# Patient Record
Sex: Female | Born: 1955 | Race: White | Hispanic: No | Marital: Married | Smoking: Never smoker
Health system: Southern US, Community
[De-identification: ages and names within clinical notes are randomized; demographics above are authoritative.]

## PROBLEM LIST (undated history)

## (undated) DIAGNOSIS — G5600 Carpal tunnel syndrome, unspecified upper limb: Secondary | ICD-10-CM

## (undated) HISTORY — PX: TONSILLECTOMY: SUR1361

---

## 2002-01-24 ENCOUNTER — Ambulatory Visit (HOSPITAL_COMMUNITY): Admission: RE | Admit: 2002-01-24 | Discharge: 2002-01-24 | Payer: Self-pay | Admitting: *Deleted

## 2002-01-24 ENCOUNTER — Encounter: Payer: Self-pay | Admitting: *Deleted

## 2003-02-04 ENCOUNTER — Ambulatory Visit (HOSPITAL_COMMUNITY): Admission: RE | Admit: 2003-02-04 | Discharge: 2003-02-04 | Payer: Self-pay | Admitting: *Deleted

## 2003-02-04 ENCOUNTER — Encounter: Payer: Self-pay | Admitting: *Deleted

## 2003-03-11 ENCOUNTER — Other Ambulatory Visit: Admission: RE | Admit: 2003-03-11 | Discharge: 2003-03-11 | Payer: Self-pay | Admitting: *Deleted

## 2004-02-24 ENCOUNTER — Ambulatory Visit (HOSPITAL_COMMUNITY): Admission: RE | Admit: 2004-02-24 | Discharge: 2004-02-24 | Payer: Self-pay | Admitting: *Deleted

## 2004-05-12 ENCOUNTER — Other Ambulatory Visit: Admission: RE | Admit: 2004-05-12 | Discharge: 2004-05-12 | Payer: Self-pay | Admitting: *Deleted

## 2005-05-10 ENCOUNTER — Ambulatory Visit (HOSPITAL_COMMUNITY): Admission: RE | Admit: 2005-05-10 | Discharge: 2005-05-10 | Payer: Self-pay | Admitting: *Deleted

## 2005-12-22 ENCOUNTER — Other Ambulatory Visit: Admission: RE | Admit: 2005-12-22 | Discharge: 2005-12-22 | Payer: Self-pay | Admitting: *Deleted

## 2006-05-31 ENCOUNTER — Ambulatory Visit (HOSPITAL_COMMUNITY): Admission: RE | Admit: 2006-05-31 | Discharge: 2006-05-31 | Payer: Self-pay | Admitting: *Deleted

## 2007-02-20 ENCOUNTER — Other Ambulatory Visit: Admission: RE | Admit: 2007-02-20 | Discharge: 2007-02-20 | Payer: Self-pay | Admitting: *Deleted

## 2007-08-29 ENCOUNTER — Ambulatory Visit (HOSPITAL_COMMUNITY): Admission: RE | Admit: 2007-08-29 | Discharge: 2007-08-29 | Payer: Self-pay | Admitting: *Deleted

## 2008-04-09 ENCOUNTER — Other Ambulatory Visit: Admission: RE | Admit: 2008-04-09 | Discharge: 2008-04-09 | Payer: Self-pay | Admitting: Gynecology

## 2008-09-02 ENCOUNTER — Ambulatory Visit (HOSPITAL_COMMUNITY): Admission: RE | Admit: 2008-09-02 | Discharge: 2008-09-02 | Payer: Self-pay | Admitting: Gynecology

## 2009-09-09 ENCOUNTER — Ambulatory Visit (HOSPITAL_COMMUNITY): Admission: RE | Admit: 2009-09-09 | Discharge: 2009-09-09 | Payer: Self-pay | Admitting: Gynecology

## 2010-09-13 ENCOUNTER — Ambulatory Visit (HOSPITAL_COMMUNITY)
Admission: RE | Admit: 2010-09-13 | Discharge: 2010-09-13 | Payer: Self-pay | Source: Home / Self Care | Attending: Gynecology | Admitting: Gynecology

## 2010-10-08 ENCOUNTER — Ambulatory Visit (HOSPITAL_COMMUNITY)
Admission: RE | Admit: 2010-10-08 | Discharge: 2010-10-08 | Payer: Self-pay | Source: Home / Self Care | Attending: Obstetrics and Gynecology | Admitting: Obstetrics and Gynecology

## 2010-10-08 LAB — ABO/RH: ABO/RH(D): A POS

## 2010-10-08 LAB — TYPE AND SCREEN
ABO/RH(D): A POS
Antibody Screen: NEGATIVE

## 2010-10-08 LAB — CBC
HCT: 36.8 % (ref 36.0–46.0)
Hemoglobin: 12.3 g/dL (ref 12.0–15.0)
MCH: 31.4 pg (ref 26.0–34.0)
MCHC: 33.4 g/dL (ref 30.0–36.0)
MCV: 93.9 fL (ref 78.0–100.0)
Platelets: 241 10*3/uL (ref 150–400)
RBC: 3.92 MIL/uL (ref 3.87–5.11)
RDW: 14.6 % (ref 11.5–15.5)
WBC: 5.2 10*3/uL (ref 4.0–10.5)

## 2011-08-19 ENCOUNTER — Other Ambulatory Visit (HOSPITAL_COMMUNITY): Payer: Self-pay | Admitting: Obstetrics and Gynecology

## 2011-08-19 DIAGNOSIS — Z1231 Encounter for screening mammogram for malignant neoplasm of breast: Secondary | ICD-10-CM

## 2011-09-21 ENCOUNTER — Ambulatory Visit (HOSPITAL_COMMUNITY)
Admission: RE | Admit: 2011-09-21 | Discharge: 2011-09-21 | Disposition: A | Discharge status: Home / Self Care | Payer: BC Managed Care – PPO | Source: Ambulatory Visit | Attending: Obstetrics and Gynecology | Admitting: Obstetrics and Gynecology

## 2011-09-21 DIAGNOSIS — Z1231 Encounter for screening mammogram for malignant neoplasm of breast: Secondary | ICD-10-CM | POA: Insufficient documentation

## 2012-08-21 ENCOUNTER — Other Ambulatory Visit (HOSPITAL_COMMUNITY): Payer: Self-pay | Admitting: Obstetrics and Gynecology

## 2012-08-21 DIAGNOSIS — Z1231 Encounter for screening mammogram for malignant neoplasm of breast: Secondary | ICD-10-CM

## 2012-09-21 ENCOUNTER — Ambulatory Visit (HOSPITAL_COMMUNITY)
Admission: RE | Admit: 2012-09-21 | Discharge: 2012-09-21 | Disposition: A | Discharge status: Home / Self Care | Payer: BC Managed Care – PPO | Source: Ambulatory Visit | Attending: Obstetrics and Gynecology | Admitting: Obstetrics and Gynecology

## 2012-09-21 DIAGNOSIS — Z1231 Encounter for screening mammogram for malignant neoplasm of breast: Secondary | ICD-10-CM | POA: Insufficient documentation

## 2013-10-30 ENCOUNTER — Other Ambulatory Visit (HOSPITAL_COMMUNITY): Payer: Self-pay | Admitting: Obstetrics and Gynecology

## 2013-10-30 DIAGNOSIS — Z1231 Encounter for screening mammogram for malignant neoplasm of breast: Secondary | ICD-10-CM

## 2013-11-06 ENCOUNTER — Ambulatory Visit (HOSPITAL_COMMUNITY)
Admission: RE | Admit: 2013-11-06 | Discharge: 2013-11-06 | Disposition: A | Discharge status: Home / Self Care | Payer: BC Managed Care – PPO | Source: Ambulatory Visit | Attending: Obstetrics and Gynecology | Admitting: Obstetrics and Gynecology

## 2013-11-06 DIAGNOSIS — Z1231 Encounter for screening mammogram for malignant neoplasm of breast: Secondary | ICD-10-CM

## 2015-01-01 ENCOUNTER — Other Ambulatory Visit: Payer: Self-pay | Admitting: Obstetrics and Gynecology

## 2015-01-02 LAB — CYTOLOGY - PAP

## 2015-01-06 ENCOUNTER — Other Ambulatory Visit: Payer: Self-pay | Admitting: Obstetrics and Gynecology

## 2015-01-06 DIAGNOSIS — R928 Other abnormal and inconclusive findings on diagnostic imaging of breast: Secondary | ICD-10-CM

## 2015-01-09 ENCOUNTER — Ambulatory Visit
Admission: RE | Admit: 2015-01-09 | Discharge: 2015-01-09 | Disposition: A | Discharge status: Home / Self Care | Payer: BLUE CROSS/BLUE SHIELD | Source: Ambulatory Visit | Attending: Obstetrics and Gynecology | Admitting: Obstetrics and Gynecology

## 2015-01-09 DIAGNOSIS — R928 Other abnormal and inconclusive findings on diagnostic imaging of breast: Secondary | ICD-10-CM

## 2015-01-13 ENCOUNTER — Other Ambulatory Visit: Payer: PRIVATE HEALTH INSURANCE

## 2015-07-16 IMAGING — US US BREAST*R* LIMITED INC AXILLA
1 series · 1 of 1 positions shown · non-contrast
Comparison: Prior exams

ACR Breast Density Category a: The breast tissue is almost entirely
fatty.

CLINICAL DATA: Call back from screening for a possible right breast
mass.

EXAM:
DIGITAL DIAGNOSTIC RIGHT MAMMOGRAM WITH 3D TOMOSYNTHESIS WITH CAD
ULTRASOUND RIGHT BREAST

[Series 1: us breast*right* limited inc axilla · 1 of 1 slices shown]
[im 1/1]
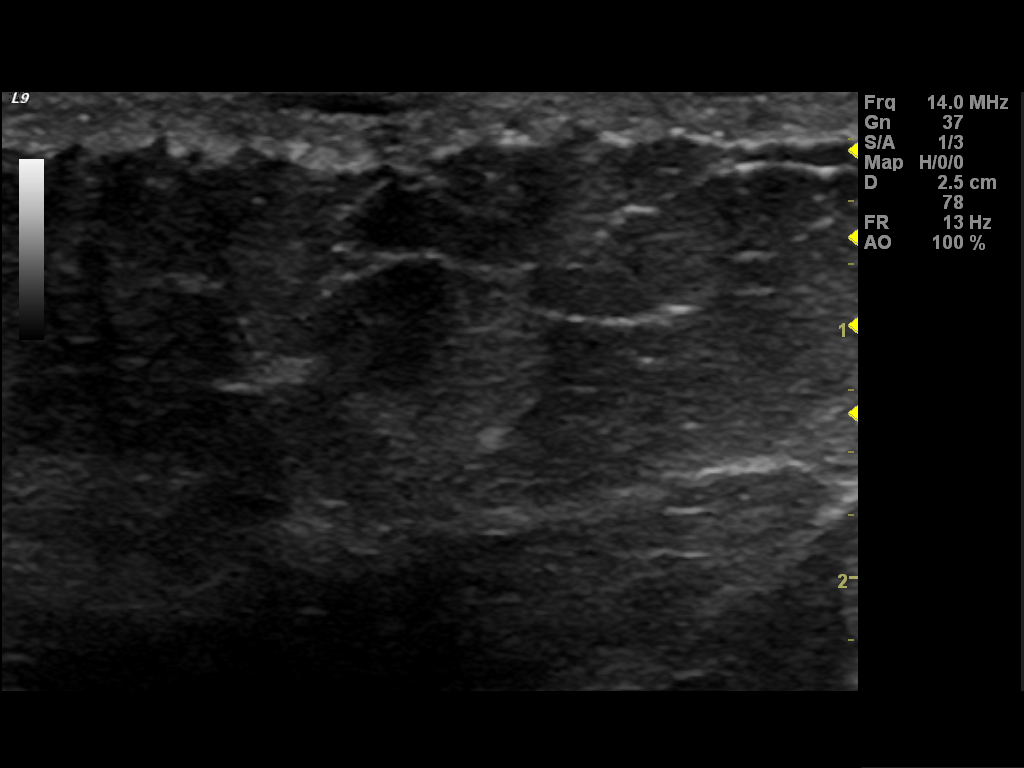

[1 of 1 positions shown; findings below may reference images not displayed]

FINDINGS: Possible mass persists on the 3D imaging projecting along the
inferior margin of the right breast, possibly within the skin. No
other abnormalities.

Mammographic images were processed with CAD.

On physical exam, there is a well-defined raised mole along the 6
o'clock position of the right breast. No other abnormality on exam.

Targeted ultrasound is performed, showing the superficial mole in
the 6 o'clock position, 4 cm the nipple. There are no breast masses
or cysts.
IMPRESSION: Benign finding. The mammographic abnormality is a skin mole. No
evidence of malignancy.

RECOMMENDATION:
Screening mammogram in one year.(Code:TC-3-4Y8)

I have discussed the findings and recommendations with the patient.
Results were also provided in writing at the conclusion of the
visit. If applicable, a reminder letter will be sent to the patient
regarding the next appointment.

BI-RADS CATEGORY  2: Benign.

## 2015-07-16 IMAGING — MG MM DIAG BREAST TOMO UNI RIGHT
5 series · 6 of 13 positions shown · non-contrast
Comparison: Prior exams

ACR Breast Density Category a: The breast tissue is almost entirely
fatty.

CLINICAL DATA: Call back from screening for a possible right breast
mass.

EXAM:
DIGITAL DIAGNOSTIC RIGHT MAMMOGRAM WITH 3D TOMOSYNTHESIS WITH CAD
ULTRASOUND RIGHT BREAST

[R TAN]
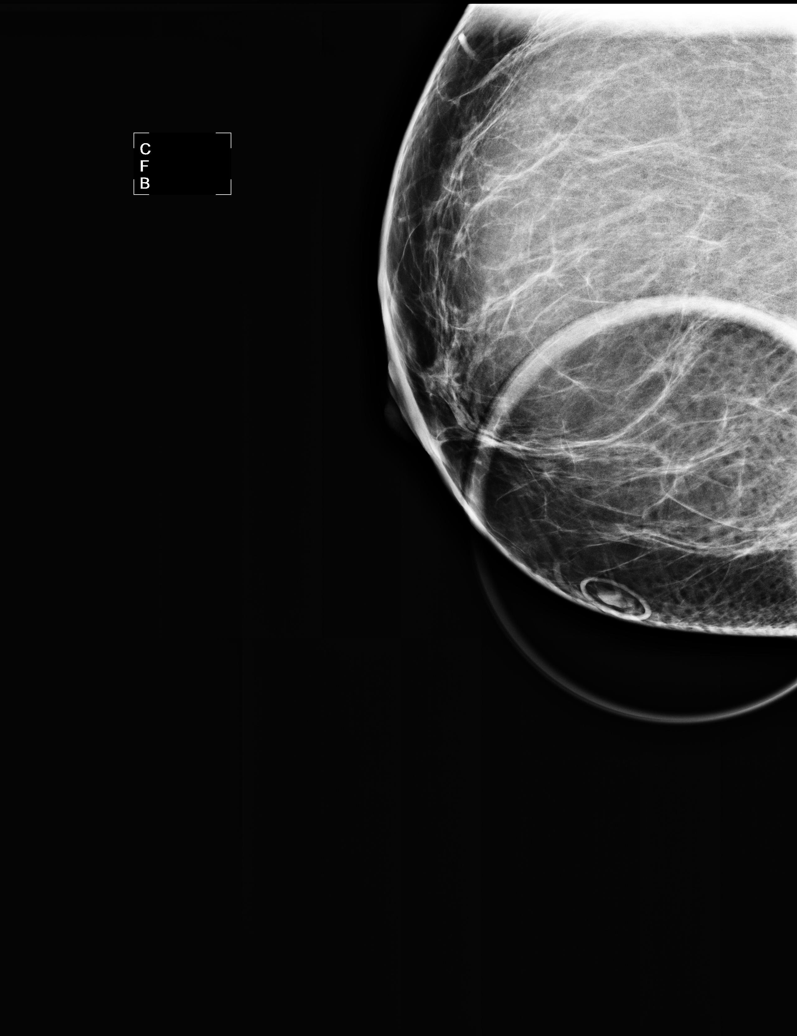

[R CC]
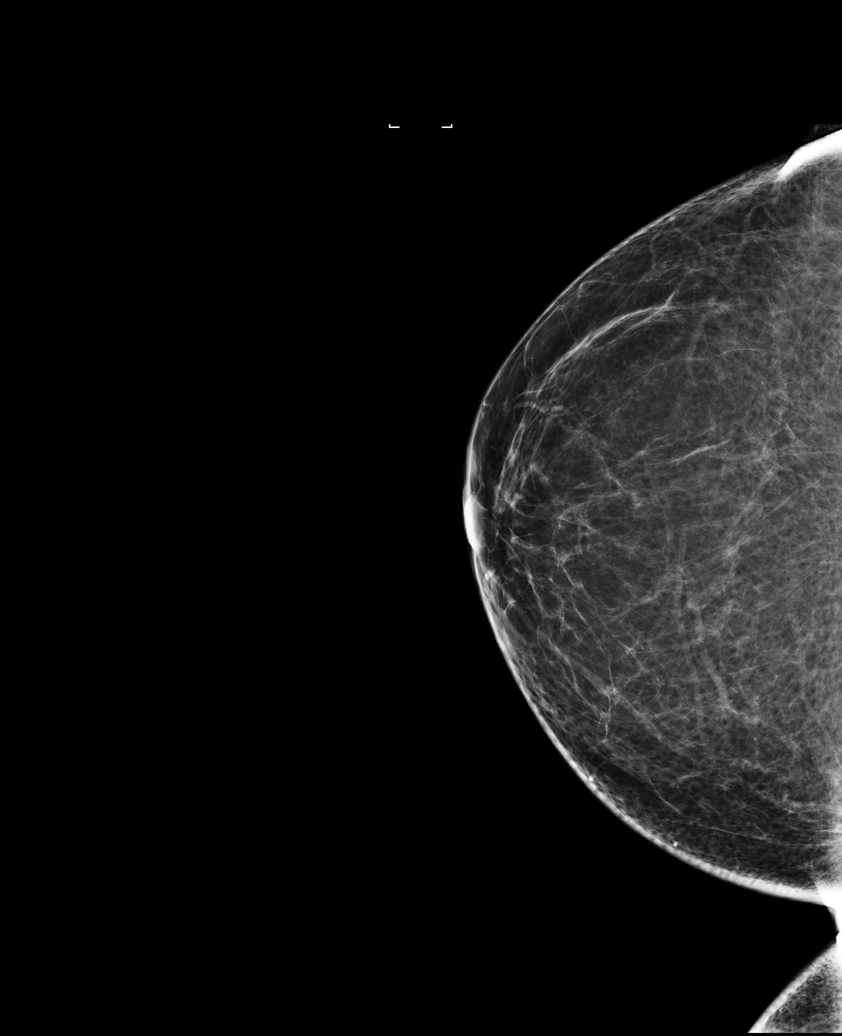

[R MLO]
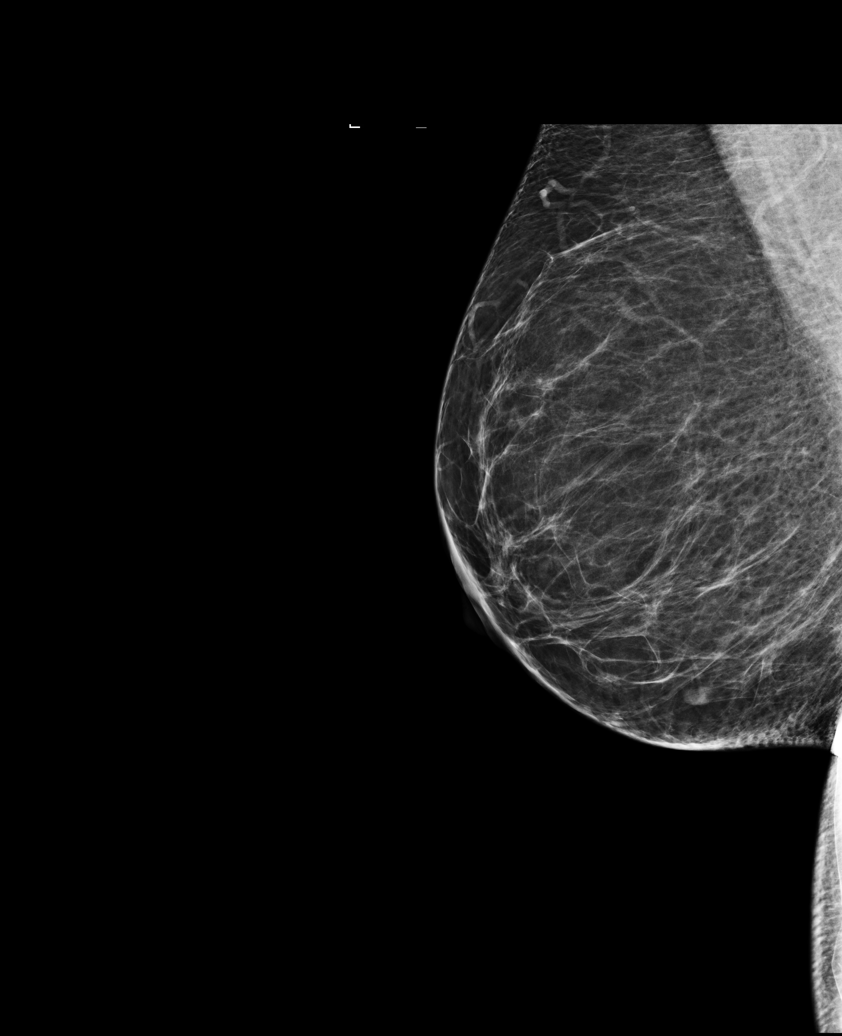

[R MLO tomo · 2 of 98 frames shown]
[frame 32/98]
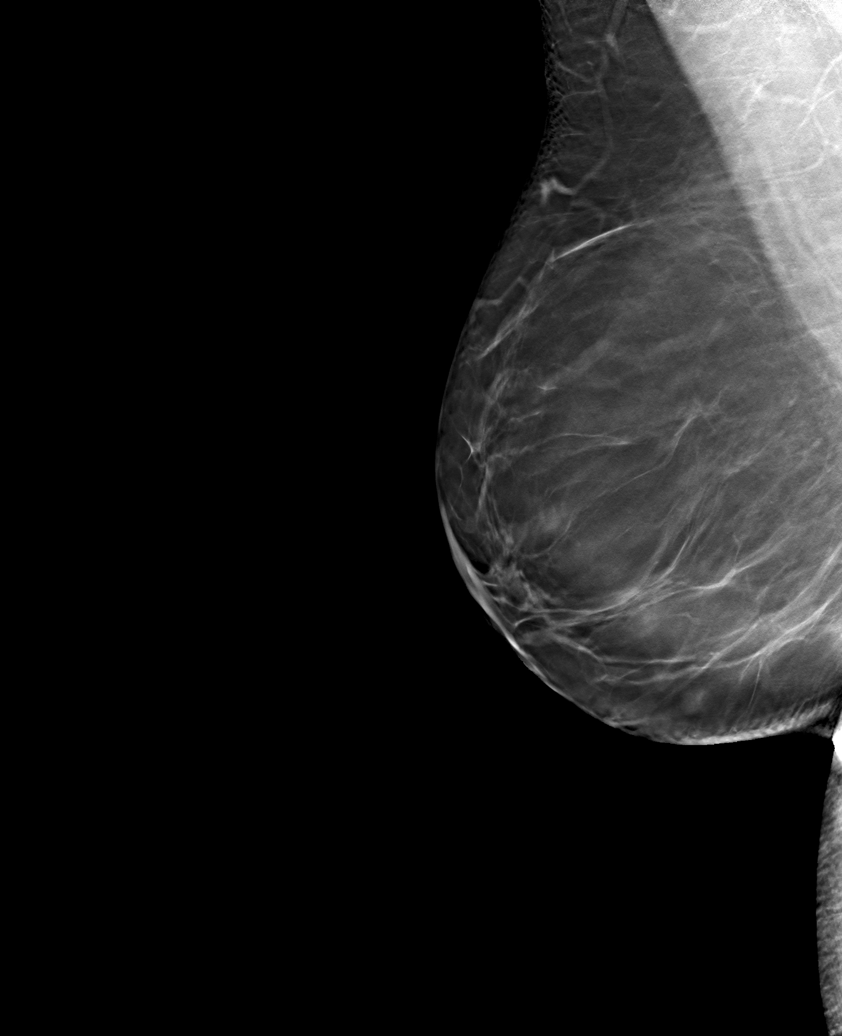
[frame 49/98]
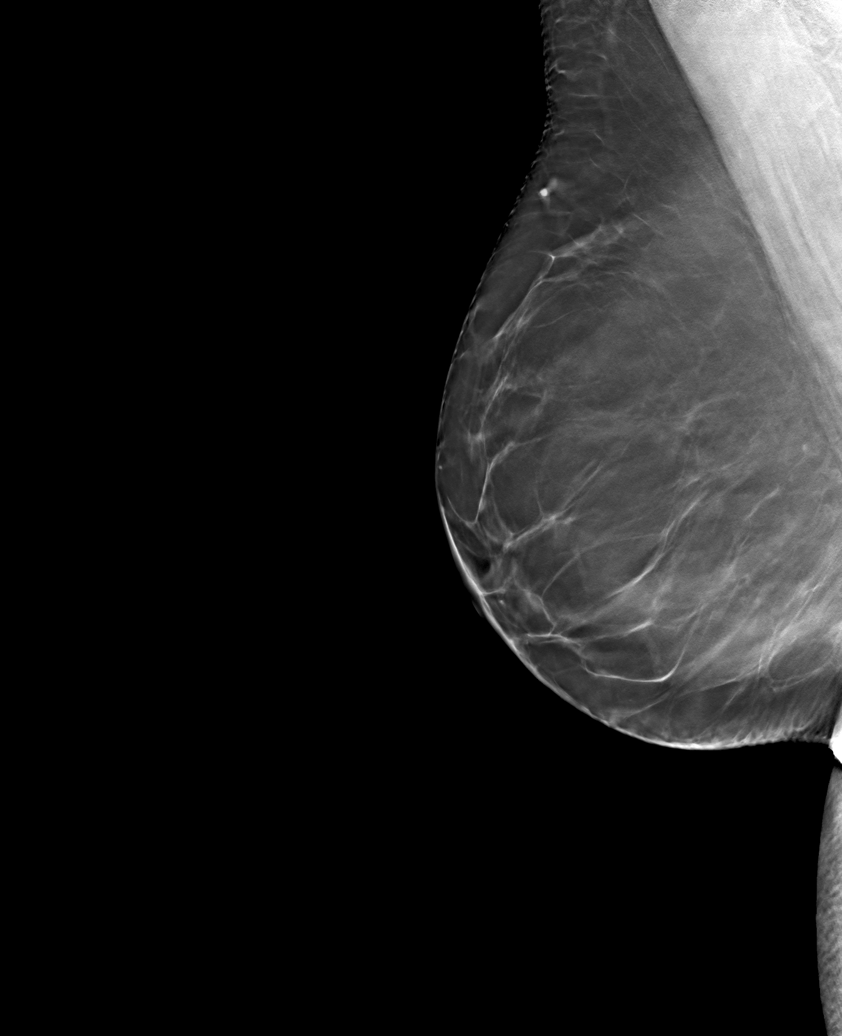

[R CC tomo · tomo slice 47/94.0]
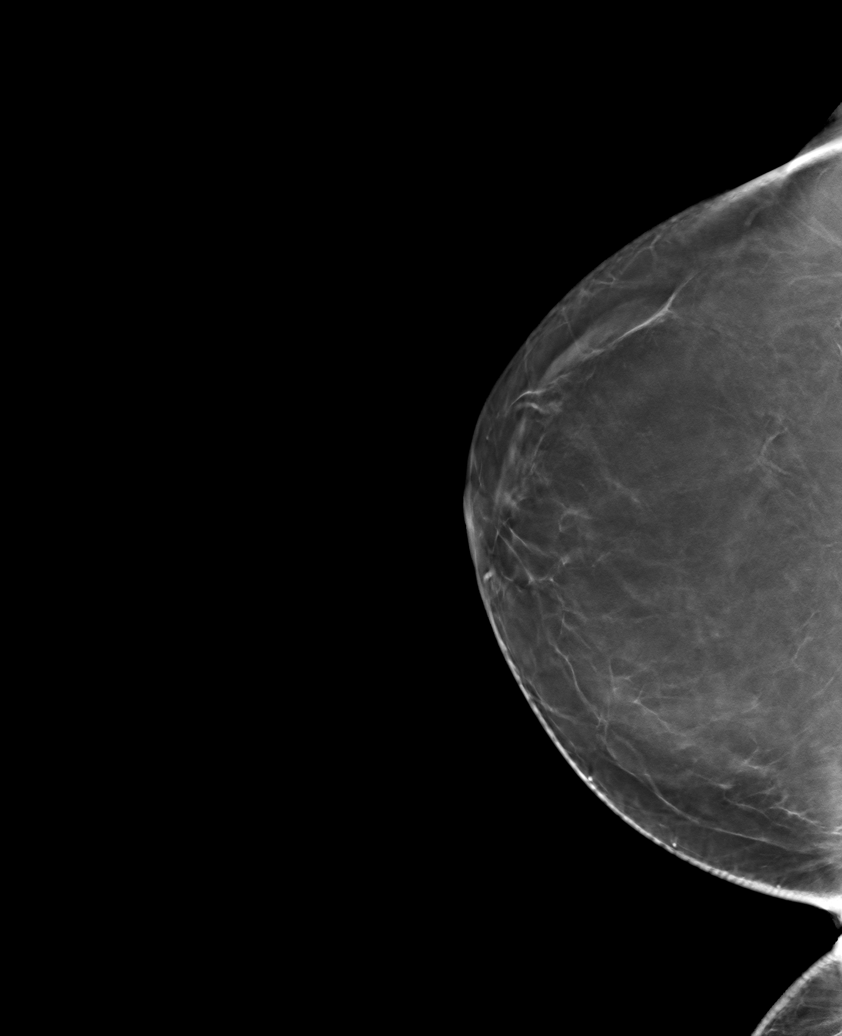

[6 of 13 positions shown; findings below may reference images not displayed]

FINDINGS: Possible mass persists on the 3D imaging projecting along the
inferior margin of the right breast, possibly within the skin. No
other abnormalities.

Mammographic images were processed with CAD.

On physical exam, there is a well-defined raised mole along the 6
o'clock position of the right breast. No other abnormality on exam.

Targeted ultrasound is performed, showing the superficial mole in
the 6 o'clock position, 4 cm the nipple. There are no breast masses
or cysts.
IMPRESSION: Benign finding. The mammographic abnormality is a skin mole. No
evidence of malignancy.

RECOMMENDATION:
Screening mammogram in one year.(Code:TC-3-4Y8)

I have discussed the findings and recommendations with the patient.
Results were also provided in writing at the conclusion of the
visit. If applicable, a reminder letter will be sent to the patient
regarding the next appointment.

BI-RADS CATEGORY  2: Benign.

## 2015-11-02 ENCOUNTER — Ambulatory Visit (AMBULATORY_SURGERY_CENTER): Payer: Self-pay | Admitting: *Deleted

## 2015-11-02 VITALS — Ht 64.0 in | Wt 182.0 lb

## 2015-11-02 DIAGNOSIS — Z1211 Encounter for screening for malignant neoplasm of colon: Secondary | ICD-10-CM

## 2015-11-02 NOTE — Progress Notes (Signed)
Patient denies any allergies to eggs or soy. Patient denies any problems with anesthesia/sedation. Patient denies any oxygen use at home and does not take any diet/weight loss medications. Patient declined EMMI education today.  

## 2015-11-13 ENCOUNTER — Ambulatory Visit (AMBULATORY_SURGERY_CENTER): Payer: BLUE CROSS/BLUE SHIELD | Admitting: Internal Medicine

## 2015-11-13 ENCOUNTER — Encounter: Payer: Self-pay | Admitting: Internal Medicine

## 2015-11-13 VITALS — BP 130/68 | HR 53 | Temp 97.3°F | Resp 16 | Ht 64.0 in | Wt 182.0 lb

## 2015-11-13 DIAGNOSIS — Z1211 Encounter for screening for malignant neoplasm of colon: Secondary | ICD-10-CM

## 2015-11-13 MED ORDER — SODIUM CHLORIDE 0.9 % IV SOLN: 500.0000 mL | INTRAVENOUS | Status: DC

## 2015-11-13 NOTE — Progress Notes (Signed)
Procedure tolerated well, to pacu, vss, spont resp, patent a/w, report to [employer]

## 2015-11-13 NOTE — Patient Instructions (Addendum)
The colonoscopy was normal.  Next routine colonoscopy in 10 years - 2027  I appreciate the opportunity to care for you. Iva Boop, MD, [facility]   Discharge instructions given. Normal exam. Resume previous medications. YOU HAD AN ENDOSCOPIC PROCEDURE TODAY AT THE [facility]:   Refer to the procedure report that was given to you for any specific questions about what was found during the examination.  If the procedure report does not answer your questions, please call your gastroenterologist to clarify.  If you requested that your care partner not be given the details of your procedure findings, then the procedure report has been included in a sealed envelope for you to review at your convenience later.  YOU SHOULD EXPECT: Some feelings of bloating in the abdomen. Passage of more gas than usual.  Walking can help get rid of the air that was put into your GI tract during the procedure and reduce the bloating. If you had a lower endoscopy (such as a colonoscopy or flexible sigmoidoscopy) you may notice spotting of blood in your stool or on the toilet paper. If you underwent a bowel prep for your procedure, you may not have a normal bowel movement for a few days.  Please Note:  You might notice some irritation and congestion in your nose or some drainage.  This is from the oxygen used during your procedure.  There is no need for concern and it should clear up in a day or so.  SYMPTOMS TO REPORT IMMEDIATELY:   Following lower endoscopy (colonoscopy or flexible sigmoidoscopy):  Excessive amounts of blood in the stool  Significant tenderness or worsening of abdominal pains  Swelling of the abdomen that is new, acute  Fever of 100F or higher   For urgent or emergent issues, a gastroenterologist can be reached at any hour by calling [phone].   DIET: Your first meal following the procedure should be a small meal and then it is ok to progress to your normal diet.  Heavy or fried foods are harder to digest and may make you feel nauseous or bloated.  Likewise, meals heavy in dairy and vegetables can increase bloating.  Drink plenty of fluids but you should avoid alcoholic beverages for 24 hours.  ACTIVITY:  You should plan to take it easy for the rest of today and you should NOT DRIVE or use heavy machinery until tomorrow (because of the sedation medicines used during the test).    FOLLOW UP: Our staff will call the number listed on your records the next business day following your procedure to check on you and address any questions or concerns that you may have regarding the information given to you following your procedure. If we do not reach you, we will leave a message.  However, if you are feeling well and you are not experiencing any problems, there is no need to return our call.  We will assume that you have returned to your regular daily activities without incident.  If any biopsies were taken you will be contacted by phone or by letter within the next 1-3 weeks.  Please call us at [phone] if you have not heard about the biopsies in 3 weeks.    SIGNATURES/CONFIDENTIALITY: You and/or your care partner have signed paperwork which will be entered into your electronic medical record.  These signatures attest to the fact that that the information above on your After Visit Summary has been reviewed and is understood.  Full responsibility  of the confidentiality of this discharge information lies with you and/or your care-partner.

## 2015-11-13 NOTE — Op Note (Signed)
[facility] [address] [place], [address]   COLONOSCOPY PROCEDURE REPORT  PATIENT: Emily, Wade  MR#: [id number] BIRTHDATE: 1956/07/28 , 59  yrs. old GENDER: female ENDOSCOPIST: Iva Boop, MD, [facility] PROCEDURE DATE:  11/13/2015 PROCEDURE:   Colonoscopy, screening First Screening Colonoscopy - Avg.  risk and is 50 yrs.  old or older Yes.  Prior Negative Screening - Now for repeat screening. N/A  History of Adenoma - Now for follow-up colonoscopy & has been > or = to 3 yrs.  N/A  Polyps removed today? No Recommend repeat exam, <10 yrs? No ASA CLASS:   Class II INDICATIONS:Screening for colonic neoplasia. MEDICATIONS: Propofol 230 mg IV and Monitored anesthesia care  DESCRIPTION OF PROCEDURE:   After the risks benefits and alternatives of the procedure were thoroughly explained, informed consent was obtained.  The digital rectal exam revealed no abnormalities of the rectum.   The LB [id number]  endoscope was introduced through the anus and advanced to the cecum, which was identified by both the appendix and ileocecal valve. No adverse events experienced.   The quality of the prep was excellent. (MiraLax was used)  The instrument was then slowly withdrawn as the colon was fully examined. Estimated blood loss is zero unless otherwise noted in this procedure report.      COLON FINDINGS: A normal appearing cecum, ileocecal valve, and appendiceal orifice were identified.  The ascending, transverse, descending, sigmoid colon, and rectum appeared unremarkable. Retroflexed views revealed no abnormalities. The time to cecum = 2.0 Withdrawal time = 8.0   The scope was withdrawn and the procedure completed. COMPLICATIONS: There were no immediate complications.  ENDOSCOPIC IMPRESSION: Normal colonoscopy - excellent prep - first screening  RECOMMENDATIONS: Repeat colonoscopy 10 years.  eSigned:  Iva Boop, MD, [facility] 11/13/2015 8:33 AM   cc:  Zelphia Cairo MD and The Patient

## 2015-11-16 ENCOUNTER — Telehealth: Payer: Self-pay | Admitting: *Deleted

## 2015-11-16 NOTE — Telephone Encounter (Signed)
  Follow up Call-  Call back number 11/13/2015  Post procedure Call Back phone  # [phone]  Permission to leave phone message Yes     Patient questions:  Do you have a fever, pain , or abdominal swelling? No. Pain Score  0 *  Have you tolerated food without any problems? Yes.    Have you been able to return to your normal activities? Yes.    Do you have any questions about your discharge instructions: Diet   No. Medications  No. Follow up visit  No.  Do you have questions or concerns about your Care? No.  Actions: * If pain score is 4 or above: No action needed, pain <4.

## 2021-03-04 ENCOUNTER — Other Ambulatory Visit: Payer: Self-pay | Admitting: Orthopedic Surgery

## 2021-04-08 ENCOUNTER — Other Ambulatory Visit: Payer: Self-pay | Admitting: Orthopedic Surgery

## 2021-05-03 ENCOUNTER — Other Ambulatory Visit: Payer: Self-pay | Admitting: Orthopedic Surgery

## 2021-06-08 ENCOUNTER — Other Ambulatory Visit: Payer: Self-pay

## 2021-06-08 ENCOUNTER — Encounter (HOSPITAL_BASED_OUTPATIENT_CLINIC_OR_DEPARTMENT_OTHER): Payer: Self-pay | Admitting: Orthopedic Surgery

## 2021-06-15 ENCOUNTER — Ambulatory Visit (HOSPITAL_BASED_OUTPATIENT_CLINIC_OR_DEPARTMENT_OTHER): Payer: BC Managed Care – PPO | Admitting: Anesthesiology

## 2021-06-15 ENCOUNTER — Ambulatory Visit (HOSPITAL_BASED_OUTPATIENT_CLINIC_OR_DEPARTMENT_OTHER)
Admission: RE | Admit: 2021-06-15 | Discharge: 2021-06-15 | Disposition: A | Discharge status: Home / Self Care | Payer: BC Managed Care – PPO | Source: Ambulatory Visit | Attending: Orthopedic Surgery | Admitting: Orthopedic Surgery

## 2021-06-15 ENCOUNTER — Encounter (HOSPITAL_BASED_OUTPATIENT_CLINIC_OR_DEPARTMENT_OTHER): Payer: Self-pay | Admitting: Orthopedic Surgery

## 2021-06-15 ENCOUNTER — Encounter (HOSPITAL_BASED_OUTPATIENT_CLINIC_OR_DEPARTMENT_OTHER)
Admission: RE | Disposition: A | Discharge status: Home / Self Care | Payer: Self-pay | Source: Ambulatory Visit | Attending: Orthopedic Surgery

## 2021-06-15 ENCOUNTER — Other Ambulatory Visit: Payer: Self-pay

## 2021-06-15 DIAGNOSIS — E669 Obesity, unspecified: Secondary | ICD-10-CM | POA: Diagnosis not present

## 2021-06-15 DIAGNOSIS — Z6830 Body mass index (BMI) 30.0-30.9, adult: Secondary | ICD-10-CM | POA: Insufficient documentation

## 2021-06-15 DIAGNOSIS — G5603 Carpal tunnel syndrome, bilateral upper limbs: Secondary | ICD-10-CM | POA: Diagnosis not present

## 2021-06-15 HISTORY — DX: Carpal tunnel syndrome, unspecified upper limb: G56.00

## 2021-06-15 HISTORY — PX: CARPAL TUNNEL RELEASE: SHX101

## 2021-06-15 SURGERY — CARPAL TUNNEL RELEASE
Anesthesia: Monitor Anesthesia Care | Site: Wrist | Laterality: Left

## 2021-06-15 SURGERY — CARPAL TUNNEL RELEASE
Anesthesia: Regional | Laterality: Left

## 2021-06-15 MED ORDER — FENTANYL CITRATE (PF) 100 MCG/2ML IJ SOLN
INTRAMUSCULAR | Status: DC | PRN
  Administered 2021-06-15: 100 ug via INTRAVENOUS

## 2021-06-15 MED ORDER — MIDAZOLAM HCL 5 MG/5ML IJ SOLN
INTRAMUSCULAR | Status: DC | PRN
  Administered 2021-06-15: 2 mg via INTRAVENOUS

## 2021-06-15 MED ORDER — CEFAZOLIN SODIUM-DEXTROSE 2-4 GM/100ML-% IV SOLN
2.0000 g | INTRAVENOUS | Status: AC
  Administered 2021-06-15: 2 g via INTRAVENOUS

## 2021-06-15 MED ORDER — PROPOFOL 500 MG/50ML IV EMUL
INTRAVENOUS | Status: DC | PRN
  Administered 2021-06-15: 50 ug/kg/min via INTRAVENOUS

## 2021-06-15 MED ORDER — LIDOCAINE HCL (PF) 0.5 % IJ SOLN
INTRAMUSCULAR | Status: DC | PRN
  Administered 2021-06-15: 30 mL via INTRAVENOUS

## 2021-06-15 MED ORDER — CEFAZOLIN SODIUM-DEXTROSE 2-4 GM/100ML-% IV SOLN: 2.0000 g | INTRAVENOUS | Status: DC

## 2021-06-15 MED ORDER — BUPIVACAINE HCL (PF) 0.25 % IJ SOLN
INTRAMUSCULAR | Status: DC | PRN
  Administered 2021-06-15: 8 mL

## 2021-06-15 MED ORDER — FENTANYL CITRATE (PF) 100 MCG/2ML IJ SOLN
INTRAMUSCULAR | Status: AC
  Filled 2021-06-15: qty 2

## 2021-06-15 MED ORDER — CEFAZOLIN SODIUM-DEXTROSE 2-4 GM/100ML-% IV SOLN
INTRAVENOUS | Status: AC
  Filled 2021-06-15: qty 100

## 2021-06-15 MED ORDER — TRAMADOL HCL 50 MG PO TABS: 50.0000 mg | ORAL_TABLET | Freq: Four times a day (QID) | ORAL | 0 refills | Status: AC | PRN

## 2021-06-15 MED ORDER — MIDAZOLAM HCL 2 MG/2ML IJ SOLN
INTRAMUSCULAR | Status: AC
  Filled 2021-06-15: qty 2

## 2021-06-15 MED ORDER — FENTANYL CITRATE (PF) 100 MCG/2ML IJ SOLN: 25.0000 ug | INTRAMUSCULAR | Status: DC | PRN

## 2021-06-15 MED ORDER — ONDANSETRON HCL 4 MG/2ML IJ SOLN: 4.0000 mg | Freq: Once | INTRAMUSCULAR | Status: DC | PRN

## 2021-06-15 MED ORDER — 0.9 % SODIUM CHLORIDE (POUR BTL) OPTIME
TOPICAL | Status: DC | PRN
Start: 2021-06-15 — End: 2021-06-15
  Administered 2021-06-15: 100 mL

## 2021-06-15 MED ORDER — ONDANSETRON HCL 4 MG/2ML IJ SOLN
INTRAMUSCULAR | Status: DC | PRN
  Administered 2021-06-15: 4 mg via INTRAVENOUS

## 2021-06-15 MED ORDER — LACTATED RINGERS IV SOLN: INTRAVENOUS | Status: DC

## 2021-06-15 SURGICAL SUPPLY — 36 items
APL PRP STRL LF DISP 70% ISPRP (MISCELLANEOUS) ×1
BLADE SURG 15 STRL LF DISP TIS (BLADE) ×1 IMPLANT
BLADE SURG 15 STRL SS (BLADE) ×3
BNDG CMPR 9X4 STRL LF SNTH (GAUZE/BANDAGES/DRESSINGS)
BNDG COHESIVE 3X5 TAN ST LF (GAUZE/BANDAGES/DRESSINGS) ×3 IMPLANT
BNDG ESMARK 4X9 LF (GAUZE/BANDAGES/DRESSINGS) IMPLANT
BNDG GAUZE ELAST 4 BULKY (GAUZE/BANDAGES/DRESSINGS) ×3 IMPLANT
CHLORAPREP W/TINT 26 (MISCELLANEOUS) ×3 IMPLANT
CORD BIPOLAR FORCEPS 12FT (ELECTRODE) ×3 IMPLANT
COVER BACK TABLE 60X90IN (DRAPES) ×3 IMPLANT
COVER MAYO STAND STRL (DRAPES) ×3 IMPLANT
CUFF TOURN SGL QUICK 18X4 (TOURNIQUET CUFF) ×3 IMPLANT
DRAPE EXTREMITY T 121X128X90 (DISPOSABLE) ×3 IMPLANT
DRAPE SURG 17X23 STRL (DRAPES) ×3 IMPLANT
DRSG PAD ABDOMINAL 8X10 ST (GAUZE/BANDAGES/DRESSINGS) ×3 IMPLANT
GAUZE SPONGE 4X4 12PLY STRL (GAUZE/BANDAGES/DRESSINGS) ×3 IMPLANT
GAUZE XEROFORM 1X8 LF (GAUZE/BANDAGES/DRESSINGS) ×3 IMPLANT
GLOVE SRG 8 PF TXTR STRL LF DI (GLOVE) IMPLANT
GLOVE SURG ORTHO LTX SZ8 (GLOVE) ×3 IMPLANT
GLOVE SURG UNDER POLY LF SZ8 (GLOVE) ×3
GLOVE SURG UNDER POLY LF SZ8.5 (GLOVE) ×3 IMPLANT
GOWN STRL REUS W/ TWL LRG LVL3 (GOWN DISPOSABLE) ×1 IMPLANT
GOWN STRL REUS W/TWL 2XL LVL3 (GOWN DISPOSABLE) ×2 IMPLANT
GOWN STRL REUS W/TWL LRG LVL3 (GOWN DISPOSABLE) ×3
GOWN STRL REUS W/TWL XL LVL3 (GOWN DISPOSABLE) ×3 IMPLANT
NDL PRECISIONGLIDE 27X1.5 (NEEDLE) IMPLANT
NEEDLE PRECISIONGLIDE 27X1.5 (NEEDLE) ×3 IMPLANT
NS IRRIG 1000ML POUR BTL (IV SOLUTION) ×3 IMPLANT
PACK BASIN DAY SURGERY FS (CUSTOM PROCEDURE TRAY) ×3 IMPLANT
STOCKINETTE 4X48 STRL (DRAPES) ×3 IMPLANT
SUT ETHILON 4 0 PS 2 18 (SUTURE) ×3 IMPLANT
SUT VICRYL 4-0 PS2 18IN ABS (SUTURE) IMPLANT
SYR BULB EAR ULCER 3OZ GRN STR (SYRINGE) ×3 IMPLANT
SYR CONTROL 10ML LL (SYRINGE) ×2 IMPLANT
TOWEL GREEN STERILE FF (TOWEL DISPOSABLE) ×3 IMPLANT
UNDERPAD 30X36 HEAVY ABSORB (UNDERPADS AND DIAPERS) ×3 IMPLANT

## 2021-06-15 NOTE — Discharge Instructions (Addendum)

## 2021-06-15 NOTE — H&P (Signed)
Emily Wade is an 65 y.o. female.   Chief Complaint: Numbness left hand HPI: Emily Wade is a 65 year old right hand dominant female coming in complaining of bilateral hand numbness and tingling. This has been going on for 10 years. She states it is gotten worse over the past month to 6 weeks. She has no known history of injury. She has no history of injury to the hand or to the neck. She states the left is worse than the right. She is awake and 5 out of 7 nights. She has no history of injury to the hand or wrist but now notices that occasionally she will have a popping sensation in the volar distal wrist area causing the shooting pain for her. This is more on the right side than the left side. She has been wearing braces at night after seeing an orthopedic surgeon 10 years ago. She states the brace on the right is okay the left has been destroyed. She is taking Aleve which has given her some relief. She has no history of diabetes thyroid problems arthritis or gout. Family history is positive diabetes negative for the remainder. She has been tested  She was referred for nerve conductions with Dr. Neldon Newport. These have been performed revealing bilateral carpal tunnel syndrome with a motor delay of 6 4 on the right 7 6 on the left with normal ulnar nerve testing. Her ultrasound revealed a median nerve of 30 on the right and 32 on the left. She has been wearing braces and seeing an orthopedic surgeon for the past 10 years. Past Medical History:  Diagnosis Date   Carpal tunnel syndrome     Past Surgical History:  Procedure Laterality Date   CESAREAN SECTION     x2   TONSILLECTOMY      Family History  Problem Relation Age of Onset   Colon cancer Neg Hx    Social History:  reports that she has never smoked. She has never used smokeless tobacco. She reports that she does not drink alcohol and does not use drugs.  Allergies: No Known Allergies  No medications prior to admission.    No results found for  this or any previous visit (from the past 48 hour(s)).  No results found.   Pertinent items are noted in HPI.  Height 5\' 4"  (1.626 m), weight 79.4 kg, last menstrual period 06/14/2012.  General appearance: alert, cooperative, and appears stated age Head: Normocephalic, without obvious abnormality Neck: no JVD Resp: clear to auscultation bilaterally Cardio: regular rate and rhythm, S1, S2 normal, no murmur, click, rub or gallop GI: soft, non-tender; bowel sounds normal; no masses,  no organomegaly Extremities:  Numbness and tingling left hand Pulses: 2+ and symmetric Skin: Skin color, texture, turgor normal. No rashes or lesions Neurologic: Grossly normal Incision/Wound: na  Assessment/Plan  Assessment:  1. Bilateral carpal tunnel syndrome    Plan: We have reviewed her nerve conductions with her. We have recommended she seriously consider decompression of the nerves. Prepare postoperative course are discussed along with risk and complications. She is aware there is no guarantee to the surgery the possibility of infection recurrence injury to arteries nerves tendons incomplete relief symptoms dystrophy. That we are attempting to halt the process give the nerve to opportunity to get better. She would like to proceed with on the left side but would like to schedule it is later in the summer. This will be scheduled as an outpatient under regional anesthesia.  [id number] 06/15/2021, 5:04 AM

## 2021-06-15 NOTE — Brief Op Note (Signed)
06/15/2021  10:35 AM  PATIENT:  Emily Wade  65 y.o. female  PRE-OPERATIVE DIAGNOSIS:  LEFT CARPAL TUNNEL SYNDROME  POST-OPERATIVE DIAGNOSIS:  LEFT CARPAL TUNNEL SYNDROME  PROCEDURE:  Procedure(s): LEFT CARPAL TUNNEL RELEASE (Left)  SURGEON:  Surgeon(s) and Role:    * Cindee Salt, MD - Primary  PHYSICIAN ASSISTANT:   ASSISTANTS: none   ANESTHESIA:   local, regional, and IV sedation  EBL:  5 mL   BLOOD ADMINISTERED:none  DRAINS: none   LOCAL MEDICATIONS USED:  BUPIVICAINE   SPECIMEN:  No Specimen  DISPOSITION OF SPECIMEN:  N/A  COUNTS:  YES  TOURNIQUET:   Total Tourniquet Time Documented: Forearm (Left) - 24 minutes Total: Forearm (Left) - 24 minutes   DICTATION: .Dragon Dictation  PLAN OF CARE: Discharge to home after PACU  PATIENT DISPOSITION:  PACU - hemodynamically stable.

## 2021-06-15 NOTE — Op Note (Signed)
NAME: Emily Wade MEDICAL RECORD NO: [id number] DATE OF BIRTH: [birth year] FACILITY: [facility] LOCATION: [facility] PHYSICIAN: Nicki Reaper, MD   OPERATIVE REPORT   DATE OF PROCEDURE: 06/15/21    PREOPERATIVE DIAGNOSIS: Carpal tunnel syndrome left hand   POSTOPERATIVE DIAGNOSIS: Same   PROCEDURE: Decompression median nerve left hand   SURGEON: Cindee Salt, M.D.   ASSISTANT: none   ANESTHESIA:  Bier block with sedation and Local   INTRAVENOUS FLUIDS:  Per anesthesia flow sheet.   ESTIMATED BLOOD LOSS:  Minimal.   COMPLICATIONS:  None.   SPECIMENS:  none   TOURNIQUET TIME:    Total Tourniquet Time Documented: Forearm (Left) - 24 minutes Total: Forearm (Left) - 24 minutes    DISPOSITION:  Stable to PACU.   INDICATIONS: Patient is a 65 year old female with a history of carpal tunnel syndrome numbness and tingling nerve conductions positive which is not responded to conservative treatment.  She is elected undergo surgical release of the median nerve left hand.  Pre-.  Postoperative course been discussed along with risk and complications.  She is aware that there is no guarantee to the surgery the possibility of infection recurrence injury to arteries nerves tendons incomplete relief of symptoms and dystrophy.  The preoperative area the patient is seen the extremity marked by both patient and surgeon antibiotic given  OPERATIVE COURSE: Patient brought the operating room placed in a supine position with the left arm free.  Forearm IV regional anesthetic was carried out without difficulty under the direction of the anesthesia department.  She was prepped with ChloraPrep.  3-minute dry time was allowed and timeout taken confirm patient procedure.  A longitudinal incision was made left palm carried down through subcutaneous tissue.  Bleeders were electrocauterized with bipolar.  Palmar fascia was split.  Superficial palmar arch was identified along with flexor tendon  to the ring little finger.  The flexor retinaculum was then released on its ulnar border with retractors protecting the median nerve flexor tendons radially and ulnar nerve ulnarly.  A right angle and stool retractor were then placed between skin and forearm fascia deep structures were dissected free with blunt dissection.  The proximal aspect of the flexor retinaculum distal forearm fascia was then released for approximately 2 cm proximal to the wrist crease under direct vision.  The canal was explored.  The branch entered ulnarly.  This was intact.  The wound was copious irrigated with saline.  The skin was closed with interrupted 4-0 nylon sutures.  Local infiltration quarter percent bupivacaine without epinephrine was instilled 9 cc was used.  Sterile compressive dressing with fingers free was applied.  Deflation of the tourniquet all fingers immediately pink.  She was taken to the recovery room for observation in satisfactory condition.  She will be discharged home to return to the hand center [facility] in 1 week on Tylenol ibuprofen for pain with Ultram for breakthrough.   Cindee Salt, MD Electronically signed, 06/15/21

## 2021-06-15 NOTE — Anesthesia Preprocedure Evaluation (Addendum)
Anesthesia Evaluation  Patient identified by MRN, date of birth, ID band Patient awake    Reviewed: Allergy & Precautions, NPO status , Patient's Chart, lab work & pertinent test results  Airway Mallampati: III  TM Distance: >3 FB Neck ROM: Full    Dental  (+) Teeth Intact, Dental Advisory Given   Pulmonary neg pulmonary ROS,    Pulmonary exam normal breath sounds clear to auscultation       Cardiovascular negative cardio ROS Normal cardiovascular exam Rhythm:Regular Rate:Normal     Neuro/Psych negative neurological ROS     GI/Hepatic negative GI ROS, Neg liver ROS,   Endo/Other  Obesity   Renal/GU negative Renal ROS     Musculoskeletal CTS   Abdominal   Peds  Hematology negative hematology ROS (+)   Anesthesia Other Findings Day of surgery medications reviewed with the patient.  Reproductive/Obstetrics                            Anesthesia Physical Anesthesia Plan  ASA: 2  Anesthesia Plan: MAC and Bier Block and Bier Block-LIDOCAINE ONLY   Post-op Pain Management:    Induction: Intravenous  PONV Risk Score and Plan: 2 and Propofol infusion and Midazolam  Airway Management Planned: Nasal Cannula and Natural Airway  Additional Equipment:   Intra-op Plan:   Post-operative Plan:   Informed Consent: I have reviewed the patients History and Physical, chart, labs and discussed the procedure including the risks, benefits and alternatives for the proposed anesthesia with the patient or authorized representative who has indicated his/her understanding and acceptance.     Dental advisory given  Plan Discussed with: CRNA  Anesthesia Plan Comments:        Anesthesia Quick Evaluation

## 2021-06-15 NOTE — Transfer of Care (Signed)
Immediate Anesthesia Transfer of Care Note  Patient: Emily Wade  Procedure(s) Performed: LEFT CARPAL TUNNEL RELEASE (Left: Wrist)  Patient Location: PACU  Anesthesia Type:MAC and Bier block  Level of Consciousness: awake, alert  and oriented  Airway & Oxygen Therapy: Patient Spontanous Breathing and Patient connected to face mask oxygen  Post-op Assessment: Report given to RN and Post -op Vital signs reviewed and stable  Post vital signs: Reviewed and stable  Last Vitals:  Vitals Value Taken Time  BP    Temp    Pulse 62 06/15/21 1030  Resp 30 06/15/21 1030  SpO2 94 % 06/15/21 1030  Vitals shown include unvalidated device data.  Last Pain:  Vitals:   06/15/21 0845  TempSrc: Oral  PainSc: 0-No pain      Patients Stated Pain Goal: 3 (37/16/96 7893)  Complications: No notable events documented.

## 2021-06-15 NOTE — Anesthesia Postprocedure Evaluation (Signed)
Anesthesia Post Note  Patient: Emily Wade  Procedure(s) Performed: LEFT CARPAL TUNNEL RELEASE (Left: Wrist)     Patient location during evaluation: PACU Anesthesia Type: MAC and Bier Block Level of consciousness: awake and alert Pain management: pain level controlled Vital Signs Assessment: post-procedure vital signs reviewed and stable Respiratory status: spontaneous breathing, nonlabored ventilation and respiratory function stable Cardiovascular status: stable and blood pressure returned to baseline Postop Assessment: no apparent nausea or vomiting Anesthetic complications: no   No notable events documented.  Last Vitals:  Vitals:   06/15/21 1045 06/15/21 1108  BP: 138/80 119/77  Pulse: (!) 51 (!) 55  Resp: 16 18  Temp:  (!) 36.4 C  SpO2: 95% 95%    Last Pain:  Vitals:   06/15/21 1108  TempSrc:   PainSc: 0-No pain                 Catalina Gravel

## 2021-06-17 ENCOUNTER — Encounter (HOSPITAL_BASED_OUTPATIENT_CLINIC_OR_DEPARTMENT_OTHER): Payer: Self-pay | Admitting: Orthopedic Surgery

## 2021-08-02 ENCOUNTER — Other Ambulatory Visit: Payer: Self-pay | Admitting: Orthopedic Surgery

## 2021-08-10 ENCOUNTER — Encounter (HOSPITAL_BASED_OUTPATIENT_CLINIC_OR_DEPARTMENT_OTHER): Payer: Self-pay | Admitting: Orthopedic Surgery

## 2021-08-10 ENCOUNTER — Encounter (HOSPITAL_BASED_OUTPATIENT_CLINIC_OR_DEPARTMENT_OTHER)
Admission: RE | Disposition: A | Discharge status: Home / Self Care | Payer: Self-pay | Source: Ambulatory Visit | Attending: Orthopedic Surgery

## 2021-08-10 ENCOUNTER — Ambulatory Visit (HOSPITAL_BASED_OUTPATIENT_CLINIC_OR_DEPARTMENT_OTHER): Payer: BC Managed Care – PPO | Admitting: Anesthesiology

## 2021-08-10 ENCOUNTER — Ambulatory Visit (HOSPITAL_BASED_OUTPATIENT_CLINIC_OR_DEPARTMENT_OTHER)
Admission: RE | Admit: 2021-08-10 | Discharge: 2021-08-10 | Disposition: A | Discharge status: Home / Self Care | Payer: BC Managed Care – PPO | Source: Ambulatory Visit | Attending: Orthopedic Surgery | Admitting: Orthopedic Surgery

## 2021-08-10 ENCOUNTER — Other Ambulatory Visit: Payer: Self-pay

## 2021-08-10 DIAGNOSIS — G5601 Carpal tunnel syndrome, right upper limb: Secondary | ICD-10-CM | POA: Diagnosis present

## 2021-08-10 HISTORY — PX: CARPAL TUNNEL RELEASE: SHX101

## 2021-08-10 SURGERY — CARPAL TUNNEL RELEASE
Anesthesia: Monitor Anesthesia Care | Site: Wrist | Laterality: Right

## 2021-08-10 MED ORDER — 0.9 % SODIUM CHLORIDE (POUR BTL) OPTIME
TOPICAL | Status: DC | PRN
  Administered 2021-08-10: 75 mL

## 2021-08-10 MED ORDER — BUPIVACAINE HCL (PF) 0.25 % IJ SOLN
INTRAMUSCULAR | Status: DC | PRN
  Administered 2021-08-10: 9 mL

## 2021-08-10 MED ORDER — CEFAZOLIN SODIUM-DEXTROSE 2-4 GM/100ML-% IV SOLN
2.0000 g | INTRAVENOUS | Status: AC
  Administered 2021-08-10: 2 g via INTRAVENOUS

## 2021-08-10 MED ORDER — ACETAMINOPHEN 500 MG PO TABS
ORAL_TABLET | ORAL | Status: AC
  Filled 2021-08-10: qty 2

## 2021-08-10 MED ORDER — FENTANYL CITRATE (PF) 100 MCG/2ML IJ SOLN
INTRAMUSCULAR | Status: DC | PRN
  Administered 2021-08-10: 50 ug via INTRAVENOUS

## 2021-08-10 MED ORDER — ACETAMINOPHEN 500 MG PO TABS
1000.0000 mg | ORAL_TABLET | Freq: Once | ORAL | Status: AC
  Administered 2021-08-10: 1000 mg via ORAL

## 2021-08-10 MED ORDER — ONDANSETRON HCL 4 MG/2ML IJ SOLN
INTRAMUSCULAR | Status: DC | PRN
  Administered 2021-08-10: 4 mg via INTRAVENOUS

## 2021-08-10 MED ORDER — PROPOFOL 500 MG/50ML IV EMUL
INTRAVENOUS | Status: DC | PRN
  Administered 2021-08-10: 75 ug/kg/min via INTRAVENOUS

## 2021-08-10 MED ORDER — TRAMADOL HCL 50 MG PO TABS: 50.0000 mg | ORAL_TABLET | Freq: Four times a day (QID) | ORAL | 0 refills | Status: AC | PRN

## 2021-08-10 MED ORDER — LIDOCAINE HCL (PF) 0.5 % IJ SOLN
INTRAMUSCULAR | Status: DC | PRN
  Administered 2021-08-10: 30 mL via INTRAVENOUS

## 2021-08-10 MED ORDER — FENTANYL CITRATE (PF) 100 MCG/2ML IJ SOLN: 25.0000 ug | INTRAMUSCULAR | Status: DC | PRN

## 2021-08-10 MED ORDER — MIDAZOLAM HCL 2 MG/2ML IJ SOLN
INTRAMUSCULAR | Status: AC
  Filled 2021-08-10: qty 2

## 2021-08-10 MED ORDER — CEFAZOLIN SODIUM-DEXTROSE 2-4 GM/100ML-% IV SOLN
INTRAVENOUS | Status: AC
  Filled 2021-08-10: qty 100

## 2021-08-10 MED ORDER — LACTATED RINGERS IV SOLN: INTRAVENOUS | Status: DC

## 2021-08-10 MED ORDER — FENTANYL CITRATE (PF) 100 MCG/2ML IJ SOLN
INTRAMUSCULAR | Status: AC
  Filled 2021-08-10: qty 2

## 2021-08-10 SURGICAL SUPPLY — 33 items
APL PRP STRL LF DISP 70% ISPRP (MISCELLANEOUS) ×1
BLADE SURG 15 STRL LF DISP TIS (BLADE) ×1 IMPLANT
BLADE SURG 15 STRL SS (BLADE) ×2
BNDG CMPR 9X4 STRL LF SNTH (GAUZE/BANDAGES/DRESSINGS)
BNDG COHESIVE 3X5 TAN ST LF (GAUZE/BANDAGES/DRESSINGS) ×2 IMPLANT
BNDG ESMARK 4X9 LF (GAUZE/BANDAGES/DRESSINGS) IMPLANT
BNDG GAUZE ELAST 4 BULKY (GAUZE/BANDAGES/DRESSINGS) ×2 IMPLANT
CHLORAPREP W/TINT 26 (MISCELLANEOUS) ×2 IMPLANT
CORD BIPOLAR FORCEPS 12FT (ELECTRODE) ×2 IMPLANT
COVER BACK TABLE 60X90IN (DRAPES) ×2 IMPLANT
COVER MAYO STAND STRL (DRAPES) ×2 IMPLANT
CUFF TOURN SGL QUICK 18X4 (TOURNIQUET CUFF) ×2 IMPLANT
DRAPE EXTREMITY T 121X128X90 (DISPOSABLE) ×2 IMPLANT
DRAPE SURG 17X23 STRL (DRAPES) ×2 IMPLANT
DRSG PAD ABDOMINAL 8X10 ST (GAUZE/BANDAGES/DRESSINGS) ×2 IMPLANT
GAUZE SPONGE 4X4 12PLY STRL (GAUZE/BANDAGES/DRESSINGS) ×2 IMPLANT
GAUZE XEROFORM 1X8 LF (GAUZE/BANDAGES/DRESSINGS) ×2 IMPLANT
GLOVE SURG ORTHO LTX SZ8 (GLOVE) ×2 IMPLANT
GLOVE SURG UNDER POLY LF SZ8.5 (GLOVE) ×2 IMPLANT
GOWN STRL REUS W/ TWL LRG LVL3 (GOWN DISPOSABLE) ×1 IMPLANT
GOWN STRL REUS W/TWL LRG LVL3 (GOWN DISPOSABLE) ×2
GOWN STRL REUS W/TWL XL LVL3 (GOWN DISPOSABLE) ×2 IMPLANT
NDL HYPO 27GX1-1/4 (NEEDLE) IMPLANT
NEEDLE HYPO 27GX1-1/4 (NEEDLE) IMPLANT
NS IRRIG 1000ML POUR BTL (IV SOLUTION) ×2 IMPLANT
PACK BASIN DAY SURGERY FS (CUSTOM PROCEDURE TRAY) ×2 IMPLANT
STOCKINETTE 4X48 STRL (DRAPES) ×2 IMPLANT
SUT ETHILON 4 0 PS 2 18 (SUTURE) ×2 IMPLANT
SUT VICRYL 4-0 PS2 18IN ABS (SUTURE) IMPLANT
SYR BULB EAR ULCER 3OZ GRN STR (SYRINGE) ×2 IMPLANT
SYR CONTROL 10ML LL (SYRINGE) IMPLANT
TOWEL GREEN STERILE FF (TOWEL DISPOSABLE) ×2 IMPLANT
UNDERPAD 30X36 HEAVY ABSORB (UNDERPADS AND DIAPERS) ×2 IMPLANT

## 2021-08-10 NOTE — Op Note (Signed)
NAME: Emily Wade MEDICAL RECORD NO: [id number] DATE OF BIRTH: 1956/01/18 FACILITY: [facility] LOCATION: [facility] PHYSICIAN: Nicki Reaper, MD   OPERATIVE REPORT   DATE OF PROCEDURE: 08/10/21    PREOPERATIVE DIAGNOSIS: Carpal tunnel syndrome right hand   POSTOPERATIVE DIAGNOSIS: Same   PROCEDURE: Decompression median nerve right hand   SURGEON: Cindee Salt, M.D.   ASSISTANT: none   ANESTHESIA:  Bier block with sedation and Local   INTRAVENOUS FLUIDS:  Per anesthesia flow sheet.   ESTIMATED BLOOD LOSS:  Minimal.   COMPLICATIONS:  None.   SPECIMENS:  none   TOURNIQUET TIME:    Total Tourniquet Time Documented: Forearm (Right) - 23 minutes Total: Forearm (Right) - 23 minutes    DISPOSITION:  Stable to PACU.   INDICATIONS: Patient is a 65 year old female with a history of bilateral carpal tunnel syndrome.  She has undergone decompression of the median nerve on her left side is admitted now for decompression of her right.  She has positive nerve conductions.  Prepare and postoperative course been discussed along with risks and complications.  She is aware that there is no guarantee to the surgery the possibility of infection recurrence injury to arteries nerves tendons complete relief symptoms dystrophy.  Preoperative area the patient seen the extremity marked by both patient and surgeon antibiotic given  OPERATIVE COURSE: Patient is brought to the operating room placed in the supine position with the right arm free.  Forearm IV regional anesthetic was carried out without difficulty under the direction of the anesthesia department.  She was prepped with ChloraPrep.  A 3-minute dry time was allowed and a timeout taken confirm patient procedure.  Longitudinal incision was made in the right palm carried down through subcutaneous tissue.  Bleeders were electrocauterized with bipolar.  Palmar fascia was split.  Superficial palmar arch was identified along with flexor  tendon to the ring little finger.  The ribs were retracted radially.  The flexor retinaculum was then released on its ulnar border.  A right angle and stool retractor placed between skin and forearm fascia and the fascia released for approximately 2 to 3 cm proximal to the wrist crease under direct vision.  The canal was explored.  Significant enlargement of the nerve was present proximally and distally with an area of hourglass deformity to the nerve.  Motor branch entered into muscle distally.  The wound was copious irrigated with saline.  The skin was closed interrupted 4-0 nylon sutures.  Local infiltration quarter percent bupivacaine without epinephrine was given approximately 9 cc was used.  Sterile compressive dressing with the fingers free was applied.  Deflation of the tourniquet all fingers immediately pink.  She was taken to the recovery room for observation in satisfactory condition.  She will be discharged home to return to the hand center [facility] in 1 week and Tylenol ibuprofen for pain with Ultram for breakthrough.   Cindee Salt, MD Electronically signed, 08/10/21

## 2021-08-10 NOTE — Anesthesia Procedure Notes (Signed)
Anesthesia Regional Block: Bier block (IV Regional)   Pre-Anesthetic Checklist: , timeout performed,  Correct Patient, Correct Site, Correct Laterality,  Correct Procedure,, site marked,  Surgical consent,  At surgeon's request  Laterality: Right         Needles:  Injection technique: Single-shot  Needle Type: Other      Needle Gauge: 20     Additional Needles:   Procedures:,,,,, intact distal pulses, Esmarch exsanguination,  Single tourniquet utilized    Narrative:   Performed by: Personally      

## 2021-08-10 NOTE — Anesthesia Preprocedure Evaluation (Addendum)
Anesthesia Evaluation  Patient identified by MRN, date of birth, ID band Patient awake    Reviewed: Allergy & Precautions, H&P , NPO status , Patient's Chart, lab work & pertinent test results  Airway Mallampati: III  TM Distance: >3 FB Neck ROM: Full    Dental no notable dental hx. (+) Teeth Intact, Dental Advisory Given   Pulmonary neg pulmonary ROS,    Pulmonary exam normal breath sounds clear to auscultation       Cardiovascular negative cardio ROS   Rhythm:Regular Rate:Normal     Neuro/Psych negative neurological ROS  negative psych ROS   GI/Hepatic negative GI ROS, Neg liver ROS,   Endo/Other  negative endocrine ROS  Renal/GU negative Renal ROS  negative genitourinary   Musculoskeletal   Abdominal   Peds  Hematology negative hematology ROS (+)   Anesthesia Other Findings   Reproductive/Obstetrics negative OB ROS                            Anesthesia Physical Anesthesia Plan  ASA: 1  Anesthesia Plan: MAC and Bier Block and Bier Block-LIDOCAINE ONLY   Post-op Pain Management:    Induction: Intravenous  PONV Risk Score and Plan: 3 and Ondansetron, Dexamethasone, Propofol infusion and Midazolam  Airway Management Planned: Simple Face Mask  Additional Equipment:   Intra-op Plan:   Post-operative Plan:   Informed Consent: I have reviewed the patients History and Physical, chart, labs and discussed the procedure including the risks, benefits and alternatives for the proposed anesthesia with the patient or authorized representative who has indicated his/her understanding and acceptance.     Dental advisory given  Plan Discussed with: CRNA  Anesthesia Plan Comments:        Anesthesia Quick Evaluation

## 2021-08-10 NOTE — H&P (Signed)
  Emily Wade is an 65 y.o. female.   Chief Complaint: numbness right hand HPI: Emily Wade is a 65 year old right hand dominant female coming in complaining of bilateral hand numbness and tingling. This has been going on for 10 years. She states it is gotten worse over the past month to 6 weeks. She has no known history of injury. She has no history of injury to the hand or to the neck. She states the left is worse than the right. She is awake and 5 out of 7 nights. She has no history of injury to the hand or wrist but now notices that occasionally she will have a popping sensation in the volar distal wrist area causing the shooting pain for her. This is more on the right side than the left side. She has been wearing braces at night after seeing an orthopedic surgeon 10 years ago. She states the brace on the right is okay the left has been destroyed. She is taking Aleve which has given her some relief. She has no history of diabetes thyroid problems arthritis or gout. Family history is positive diabetes negative for the remainder. She has been tested She was referred for nerve conductions with Dr. Neldon Newport. These have been performed revealing bilateral carpal tunnel syndrome with a motor delay of 6 4 on the right 7 6 on the left with normal ulnar nerve testing. Her ultrasound revealed a median nerve of 30 on the right and 32 on the left.She is post left carpal tunnel release.   Past Medical History:  Diagnosis Date   Carpal tunnel syndrome     Past Surgical History:  Procedure Laterality Date   CARPAL TUNNEL RELEASE Left 06/15/2021   Procedure: LEFT CARPAL TUNNEL RELEASE;  Surgeon: Cindee Salt, MD;  Location: [facility];  Service: Orthopedics;  Laterality: Left;   CESAREAN SECTION     x2   TONSILLECTOMY      Family History  Problem Relation Age of Onset   Colon cancer Neg Hx    Social History:  reports that she has never smoked. She has never used smokeless tobacco. She reports that  she does not drink alcohol and does not use drugs.  Allergies: No Known Allergies  No medications prior to admission.    No results found for this or any previous visit (from the past 48 hour(s)).  No results found.   Pertinent items are noted in HPI.  Last menstrual period 06/14/2012.  General appearance: alert, cooperative, and appears stated age Head: Normocephalic, without obvious abnormality Neck: no JVD Resp: clear to auscultation bilaterally Cardio: regular rate and rhythm, S1, S2 normal, no murmur, click, rub or gallop GI: soft, non-tender; bowel sounds normal; no masses,  no organomegaly Extremities:  numbness right hand Pulses: 2+ and symmetric Skin: Skin color, texture, turgor normal. No rashes or lesions Neurologic: Grossly normal Incision/Wound: na  Assessment/Plan Carpal tunnel syndrome right She is scheduled for right carpal tunnel release in outpatient under regional anesthesia.  He is aware there is no guarantee to the surgery the possibility of infection recurrence injury to arteries nerves tendons incomplete relief symptoms dystrophy. Cindee Salt 08/10/2021, 10:18 AM

## 2021-08-10 NOTE — Anesthesia Procedure Notes (Signed)
Procedure Name: MAC Date/Time: 08/10/2021 11:26 AM Performed by: Signe Colt, CRNA Pre-anesthesia Checklist: Patient identified, Emergency Drugs available, Suction available, Patient being monitored and Timeout performed Patient Re-evaluated:Patient Re-evaluated prior to induction Oxygen Delivery Method: Simple face mask

## 2021-08-10 NOTE — Anesthesia Postprocedure Evaluation (Signed)
Anesthesia Post Note  Patient: Emily Wade  Procedure(s) Performed: RIGHT CARPAL TUNNEL RELEASE (Right: Wrist)     Patient location during evaluation: PACU Anesthesia Type: MAC and Bier Block Level of consciousness: awake and alert Pain management: pain level controlled Vital Signs Assessment: post-procedure vital signs reviewed and stable Respiratory status: spontaneous breathing, nonlabored ventilation and respiratory function stable Cardiovascular status: stable and blood pressure returned to baseline Postop Assessment: no apparent nausea or vomiting Anesthetic complications: no   No notable events documented.  Last Vitals:  Vitals:   08/10/21 1215 08/10/21 1240  BP: 93/61 (!) 100/50  Pulse: (!) 46 (!) 58  Resp: 16 16  Temp:  36.4 C  SpO2: 98% 94%    Last Pain:  Vitals:   08/10/21 1240  TempSrc:   PainSc: 0-No pain                 Leatrice Parilla,W. EDMOND

## 2021-08-10 NOTE — Discharge Instructions (Addendum)

## 2021-08-10 NOTE — Transfer of Care (Signed)
Immediate Anesthesia Transfer of Care Note  Patient: Emily Wade  Procedure(s) Performed: RIGHT CARPAL TUNNEL RELEASE (Right: Wrist)  Patient Location: PACU  Anesthesia Type:Bier block  Level of Consciousness: awake, alert , oriented and patient cooperative  Airway & Oxygen Therapy: Patient Spontanous Breathing and Patient connected to face mask oxygen  Post-op Assessment: Report given to RN and Post -op Vital signs reviewed and stable  Post vital signs: Reviewed and stable  Last Vitals:  Vitals Value Taken Time  BP    Temp    Pulse 50 08/10/21 1150  Resp    SpO2 98 % 08/10/21 1150  Vitals shown include unvalidated device data.  Last Pain:  Vitals:   08/10/21 1040  TempSrc: Oral      Patients Stated Pain Goal: 3 (08/10/21 1040)  Complications: No notable events documented.

## 2021-08-10 NOTE — Brief Op Note (Signed)
08/10/2021  11:48 AM  PATIENT:  Julianne Handler  65 y.o. female  PRE-OPERATIVE DIAGNOSIS:  RIGHT CARPAL TUNNEL SYNDROME  POST-OPERATIVE DIAGNOSIS:  RIGHT CARPAL TUNNEL SYNDROME  PROCEDURE:  Procedure(s): RIGHT CARPAL TUNNEL RELEASE (Right)  SURGEON:  Surgeon(s) and Role:    * Cindee Salt, MD - Primary  PHYSICIAN ASSISTANT:   ASSISTANTS: none   ANESTHESIA:   local, regional, and IV sedation  EBL:  20ml  BLOOD ADMINISTERED:none  DRAINS: none   LOCAL MEDICATIONS USED:  BUPIVICAINE   SPECIMEN:  No Specimen  DISPOSITION OF SPECIMEN:  N/A  COUNTS:  YES  TOURNIQUET:   Total Tourniquet Time Documented: Forearm (Right) - 23 minutes Total: Forearm (Right) - 23 minutes   DICTATION: .Reubin Milan Dictation  PLAN OF CARE: Discharge to home after PACU  PATIENT DISPOSITION:  PACU - hemodynamically stable.

## 2021-08-11 NOTE — Progress Notes (Signed)
Left message stating courtesy call and if any questions or concerns please call the doctors office.  

## 2021-08-12 ENCOUNTER — Encounter (HOSPITAL_BASED_OUTPATIENT_CLINIC_OR_DEPARTMENT_OTHER): Payer: Self-pay | Admitting: Orthopedic Surgery
